# Patient Record
Sex: Female | Born: 1985 | Race: White | Hispanic: No | Marital: Single | Smoking: Never smoker
Health system: Southern US, Community
[De-identification: ages and names within clinical notes are randomized; demographics above are authoritative.]

## PROBLEM LIST (undated history)

## (undated) DIAGNOSIS — N200 Calculus of kidney: Secondary | ICD-10-CM

---

## 2015-08-27 ENCOUNTER — Encounter: Payer: Self-pay | Admitting: Emergency Medicine

## 2015-08-27 ENCOUNTER — Emergency Department
Admission: EM | Admit: 2015-08-27 | Discharge: 2015-08-27 | Disposition: A | Discharge status: Home / Self Care | Payer: Self-pay | Attending: Emergency Medicine | Admitting: Emergency Medicine

## 2015-08-27 ENCOUNTER — Emergency Department: Payer: Self-pay

## 2015-08-27 DIAGNOSIS — Z3202 Encounter for pregnancy test, result negative: Secondary | ICD-10-CM | POA: Insufficient documentation

## 2015-08-27 DIAGNOSIS — N23 Unspecified renal colic: Secondary | ICD-10-CM | POA: Insufficient documentation

## 2015-08-27 HISTORY — DX: Calculus of kidney: N20.0

## 2015-08-27 LAB — COMPREHENSIVE METABOLIC PANEL
ALBUMIN: 3.6 g/dL (ref 3.5–5.0)
ALK PHOS: 96 U/L (ref 38–126)
ALT: 17 U/L (ref 14–54)
ANION GAP: 6 (ref 5–15)
AST: 20 U/L (ref 15–41)
BUN: 14 mg/dL (ref 6–20)
CALCIUM: 8.9 mg/dL (ref 8.9–10.3)
CHLORIDE: 104 mmol/L (ref 101–111)
CO2: 26 mmol/L (ref 22–32)
CREATININE: 0.76 mg/dL (ref 0.44–1.00)
GFR calc Af Amer: >60 mL/min (ref >60)
GFR calc non Af Amer: >60 mL/min (ref >60)
GLUCOSE: 140 mg/dL — AB (ref 65–99)
Potassium: 3.8 mmol/L (ref 3.5–5.1)
SODIUM: 136 mmol/L (ref 135–145)
Total Bilirubin: 0.6 mg/dL (ref 0.3–1.2)
Total Protein: 7.1 g/dL (ref 6.5–8.1)

## 2015-08-27 LAB — URINALYSIS COMPLETE WITH MICROSCOPIC (ARMC ONLY)
Bilirubin Urine: NEGATIVE
Glucose, UA: NEGATIVE mg/dL
KETONES UR: NEGATIVE mg/dL
Leukocytes, UA: NEGATIVE
Nitrite: NEGATIVE
PH: 6 (ref 5.0–8.0)
PROTEIN: NEGATIVE mg/dL
Specific Gravity, Urine: 1.018 (ref 1.005–1.030)

## 2015-08-27 LAB — CBC
HCT: 38.8 % (ref 35.0–47.0)
HEMOGLOBIN: 13.2 g/dL (ref 12.0–16.0)
MCH: 29 pg (ref 26.0–34.0)
MCHC: 34 g/dL (ref 32.0–36.0)
MCV: 85.2 fL (ref 80.0–100.0)
Platelets: 294 10*3/uL (ref 150–440)
RBC: 4.55 MIL/uL (ref 3.80–5.20)
RDW: 13.4 % (ref 11.5–14.5)
WBC: 12.6 10*3/uL — ABNORMAL HIGH (ref 3.6–11.0)

## 2015-08-27 LAB — HCG, QUANTITATIVE, PREGNANCY: HCG, BETA CHAIN, QUANT, S: 1 m[IU]/mL (ref <5)

## 2015-08-27 MED ORDER — TAMSULOSIN HCL 0.4 MG PO CAPS
0.4000 mg | ORAL_CAPSULE | Freq: Once | ORAL | Status: AC
  Administered 2015-08-27: 0.4 mg via ORAL
  Filled 2015-08-27: qty 1

## 2015-08-27 MED ORDER — ONDANSETRON HCL 4 MG PO TABS: 4.0000 mg | ORAL_TABLET | Freq: Three times a day (TID) | ORAL | Status: AC | PRN

## 2015-08-27 MED ORDER — ONDANSETRON HCL 4 MG/2ML IJ SOLN
4.0000 mg | Freq: Once | INTRAMUSCULAR | Status: AC
  Administered 2015-08-27: 4 mg via INTRAVENOUS

## 2015-08-27 MED ORDER — HYDROMORPHONE HCL 1 MG/ML IJ SOLN
1.0000 mg | Freq: Once | INTRAMUSCULAR | Status: AC
  Administered 2015-08-27: 1 mg via INTRAVENOUS
  Filled 2015-08-27: qty 1

## 2015-08-27 MED ORDER — HALOPERIDOL LACTATE 5 MG/ML IJ SOLN
1.0000 mg | Freq: Once | INTRAMUSCULAR | Status: AC
  Administered 2015-08-27: 1 mg via INTRAVENOUS
  Filled 2015-08-27: qty 1

## 2015-08-27 MED ORDER — SODIUM CHLORIDE 0.9 % IV BOLUS (SEPSIS)
1000.0000 mL | Freq: Once | INTRAVENOUS | Status: AC
  Administered 2015-08-27: 1000 mL via INTRAVENOUS

## 2015-08-27 MED ORDER — ONDANSETRON HCL 4 MG/2ML IJ SOLN
4.0000 mg | Freq: Once | INTRAMUSCULAR | Status: AC
  Administered 2015-08-27: 4 mg via INTRAVENOUS
  Filled 2015-08-27: qty 2

## 2015-08-27 MED ORDER — KETOROLAC TROMETHAMINE 30 MG/ML IJ SOLN
30.0000 mg | Freq: Once | INTRAMUSCULAR | Status: AC
  Administered 2015-08-27: 30 mg via INTRAVENOUS
  Filled 2015-08-27: qty 1

## 2015-08-27 MED ORDER — DIPHENHYDRAMINE HCL 50 MG/ML IJ SOLN
25.0000 mg | Freq: Once | INTRAMUSCULAR | Status: AC
  Administered 2015-08-27: 25 mg via INTRAVENOUS
  Filled 2015-08-27: qty 1

## 2015-08-27 MED ORDER — OXYCODONE-ACETAMINOPHEN 5-325 MG PO TABS: 1.0000 | ORAL_TABLET | ORAL | Status: AC | PRN

## 2015-08-27 MED ORDER — OXYCODONE-ACETAMINOPHEN 5-325 MG PO TABS
2.0000 | ORAL_TABLET | Freq: Once | ORAL | Status: AC
  Administered 2015-08-27: 2 via ORAL
  Filled 2015-08-27: qty 2

## 2015-08-27 MED ORDER — TAMSULOSIN HCL 0.4 MG PO CAPS: 0.4000 mg | ORAL_CAPSULE | Freq: Every day | ORAL | Status: AC

## 2015-08-27 MED ORDER — SODIUM CHLORIDE 0.9 % IV SOLN
1000.0000 mL | Freq: Once | INTRAVENOUS | Status: AC
  Administered 2015-08-27: 1000 mL via INTRAVENOUS

## 2015-08-27 NOTE — ED Provider Notes (Signed)
-----------------------------------------   7:47 AM on 08/27/2015 -----------------------------------------   Blood pressure 134/90, pulse 97, temperature 97.7 F (36.5 C), temperature source Oral, resp. rate 16, height 5\' 8"  (1.727 m), weight 365 lb (165.563 kg), last menstrual period 08/09/2015, SpO2 96 %.  Assuming care from Dr. Dolores FrameSung.  In short, Mary Baird is a 29 y.o. female with a chief complaint of Back Pain .  Refer to the original H&P for additional details.  The current plan of care is to follow-up the urinalysis results and reassess for pain control and nausea control.   ----------------------------------------- 8:48 AM on 08/27/2015 -----------------------------------------  The urinalysis is significant only for hematuria.  There is no evidence of infection and the patient does not need antibiotics at this time; to be absolutely certain, a urine culture was sent as well.  The patient complains of persistent moderate pain so she was given 2 Percocet by mouth.  She had another episode of emesis so we are attempting to improve this with Haldol 1 mg IV and Benadryl 25 mg IV, a combination frequently used by anesthesiologist to control nausea and vomiting.  I anticipate she will be appropriate for discharge and outpatient follow-up as recommended by Dr. Dolores FrameSung.  Mary Roseory Anaiz Qazi, MD 08/27/15 [phone]

## 2015-08-27 NOTE — ED Notes (Signed)
Pt says she went to bed feeling fine; woke with right mid back pain; has not voided since; history of kidney stones but it's been about 5 years; thinks she may have one now; nausea with vomiting, last vomited upon arrival to ED parking lot

## 2015-08-27 NOTE — ED Provider Notes (Signed)
[facility] Emergency Department Provider Note  ____________________________________________  Time seen: Approximately 4:31 AM  I have reviewed the triage vital signs and the nursing notes.   HISTORY  Chief Complaint Back Pain    HPI Mary Baird is a 29 y.o. female who presents to the ED from home with a chief complaint of left flank pain. Patient was awake and at approximately 1 AM with sharp, stabbing left flank pain which radiated to her left lower quadrant. Prior history of kidney stones; no prior lithotripsy or stents. Symptoms this morning are associated with nausea and vomiting, and difficulty voiding. Denies fever, chills, chest pain, shortness of breath, dysuria, hematuria, diarrhea. Nothing makes her symptoms better or worse.   Past Medical History  Diagnosis Date  . Kidney stone     There are no active problems to display for this patient.   History reviewed. No pertinent past surgical history.  No current outpatient prescriptions on file.  Allergies Review of patient's allergies indicates no known allergies.  History reviewed. No pertinent family history.  Social History Social History  Substance Use Topics  . Smoking status: Never Smoker   . Smokeless tobacco: Never Used  . Alcohol Use: No    Review of Systems Constitutional: No fever/chills Eyes: No visual changes. ENT: No sore throat. Cardiovascular: Denies chest pain. Respiratory: Denies shortness of breath. Gastrointestinal: Positive for left flank and abdominal pain.  Positive for nausea and vomiting.  No diarrhea.  No constipation. Genitourinary: Negative for dysuria. Musculoskeletal: Negative for back pain. Skin: Negative for rash. Neurological: Negative for headaches, focal weakness or numbness.  10-point ROS otherwise negative.  ____________________________________________   PHYSICAL EXAM:  VITAL SIGNS: ED Triage Vitals  Enc Vitals Group     BP 08/27/15  0318 150/93 mmHg     Pulse Rate 08/27/15 0318 99     Resp 08/27/15 0318 20     Temp 08/27/15 0318 97.7 F (36.5 C)     Temp Source 08/27/15 0318 Oral     SpO2 08/27/15 0318 99 %     Weight 08/27/15 0318 365 lb (165.563 kg)     Height 08/27/15 0318  (1.727 m)     Head Cir --      Peak Flow --      Pain Score 08/27/15 0320 10     Pain Loc --      Pain Edu? --      Excl. in GC? --     Constitutional: Alert and oriented. Well appearing and in mild acute distress. Eyes: Conjunctivae are normal. PERRL. EOMI. Head: Atraumatic. Nose: No congestion/rhinnorhea. Mouth/Throat: Mucous membranes are moist.  Oropharynx non-erythematous. Neck: No stridor.   Cardiovascular: Normal rate, regular rhythm. Grossly normal heart sounds.  Good peripheral circulation. Respiratory: Normal respiratory effort.  No retractions. Lungs CTAB. Gastrointestinal: Soft and nontender. No distention. No abdominal bruits. Mild left CVA tenderness. Musculoskeletal: No lower extremity tenderness nor edema.  No joint effusions. Neurologic:  Normal speech and language. No gross focal neurologic deficits are appreciated.  Skin:  Skin is warm, dry and intact. No rash noted. Psychiatric: Mood and affect are normal. Speech and behavior are normal.  ____________________________________________   LABS (all labs ordered are listed, but only abnormal results are displayed)  Labs Reviewed  COMPREHENSIVE METABOLIC PANEL - Abnormal; Notable for the following:    Glucose, Bld 140 (*)    All other components within normal limits  CBC - Abnormal; Notable for the following:  WBC 12.6 (*)    All other components within normal limits  HCG, QUANTITATIVE, PREGNANCY  URINALYSIS COMPLETEWITH MICROSCOPIC ([facility] ONLY)   ____________________________________________  EKG  None ____________________________________________  RADIOLOGY  CT Renal Stone Study interpreted per Dr. Andria MeuseStevens: Larina BrasStone in the proximal left ureter with  moderate proximal obstruction. Uterine fibroid. ____________________________________________   PROCEDURES  Procedure(s) performed: None  Critical Care performed: No  ____________________________________________   INITIAL IMPRESSION / ASSESSMENT AND PLAN / ED COURSE  Pertinent labs & imaging results that were available during my care of the patient were reviewed by me and considered in my medical decision making (see chart for details).  29 year old female who presents with left flank pain radiating to her left lower quadrant; prior history of stones. Will initiate IV fluid resuscitation, IV analgesia and antiemetic; obtain CT renal study to evaluate for kidney stones.  ----------------------------------------- 6:58 AM on 08/27/2015 -----------------------------------------  Patient improved after Dilaudid. Second liter IV fluids infusing. Updated patient and family of laboratory and imaging results. Anticipate discharge home if pain is controlled with close urology follow-up. Awaiting urinalysis results. Care transferred to Dr. York CeriseForbach pending urinalysis and reassessment for pain control. ____________________________________________   FINAL CLINICAL IMPRESSION(S) / ED DIAGNOSES  Final diagnoses:  Renal colic on left side      Irean HongJade J Ethelwyn Gilbertson, MD 08/27/15 [phone]

## 2015-08-27 NOTE — Discharge Instructions (Signed)
1. Take pain & nausea medicines as needed (Percocet/Zofran #30). Make sure to take a stool softener while taking narcotic pain medicines. °2. Take Flomax 0.4mg daily x 14 days. °3. Drink plenty of bottled or filtered water daily. °4. Return to the ER for worsening symptoms, persistent vomiting, fever, difficulty breathing or other concerns. ° ° °Kidney Stones °Kidney stones (urolithiasis) are deposits that form inside your kidneys. The intense pain is caused by the stone moving through the urinary tract. When the stone moves, the ureter goes into spasm around the stone. The stone is usually passed in the urine.  °CAUSES  °· A disorder that makes certain neck glands produce too much parathyroid hormone (primary hyperparathyroidism). °· A buildup of uric acid crystals, similar to gout in your joints. °· Narrowing (stricture) of the ureter. °· A kidney obstruction present at birth (congenital obstruction). °· Previous surgery on the kidney or ureters. °· Numerous kidney infections. °SYMPTOMS  °· Feeling sick to your stomach (nauseous). °· Throwing up (vomiting). °· Blood in the urine (hematuria). °· Pain that usually spreads (radiates) to the groin. °· Frequency or urgency of urination. °DIAGNOSIS  °· Taking a history and physical exam. °· Blood or urine tests. °· CT scan. °· Occasionally, an examination of the inside of the urinary bladder (cystoscopy) is performed. °TREATMENT  °· Observation. °· Increasing your fluid intake. °· Extracorporeal shock wave lithotripsy--This is a noninvasive procedure that uses shock waves to break up kidney stones. °· Surgery may be needed if you have severe pain or persistent obstruction. There are various surgical procedures. Most of the procedures are performed with the use of small instruments. Only small incisions are needed to accommodate these instruments, so recovery time is minimized. °The size, location, and chemical composition are all important variables that will determine  the proper choice of action for you. Talk to your health care provider to better understand your situation so that you will minimize the risk of injury to yourself and your kidney.  °HOME CARE INSTRUCTIONS  °· Drink enough water and fluids to keep your urine clear or pale yellow. This will help you to pass the stone or stone fragments. °· Strain all urine through the provided strainer. Keep all particulate matter and stones for your health care provider to see. The stone causing the pain may be as small as a grain of salt. It is very important to use the strainer each and every time you pass your urine. The collection of your stone will allow your health care provider to analyze it and verify that a stone has actually passed. The stone analysis will often identify what you can do to reduce the incidence of recurrences. °· Only take over-the-counter or prescription medicines for pain, discomfort, or fever as directed by your health care provider. °· Keep all follow-up visits as told by your health care provider. This is important. °· Get follow-up X-rays if required. The absence of pain does not always mean that the stone has passed. It may have only stopped moving. If the urine remains completely obstructed, it can cause loss of kidney function or even complete destruction of the kidney. It is your responsibility to make sure X-rays and follow-ups are completed. Ultrasounds of the kidney can show blockages and the status of the kidney. Ultrasounds are not associated with any radiation and can be performed easily in a matter of minutes. °· Make changes to your daily diet as told by your health care provider. You may be told   to: °¨ Limit the amount of salt that you eat. °¨ Eat 5 or more servings of fruits and vegetables each day. °¨ Limit the amount of meat, poultry, fish, and eggs that you eat. °· Collect a 24-hour urine sample as told by your health care provider. You may need to collect another urine sample every  6-12 months. °SEEK MEDICAL CARE IF: °· You experience pain that is progressive and unresponsive to any pain medicine you have been prescribed. °SEEK IMMEDIATE MEDICAL CARE IF:  °· Pain cannot be controlled with the prescribed medicine. °· You have a fever or shaking chills. °· The severity or intensity of pain increases over 18 hours and is not relieved by pain medicine. °· You develop a new onset of abdominal pain. °· You feel faint or pass out. °· You are unable to urinate. °  °This information is not intended to replace advice given to you by your health care provider. Make sure you discuss any questions you have with your health care provider. °  °Document Released: 10/28/2005 Document Revised: 07/19/2015 Document Reviewed: 03/31/2013 °Elsevier Interactive Patient Education ©2016 [employer] ° °

## 2015-08-28 LAB — URINE CULTURE: SPECIAL REQUESTS: NORMAL

## 2018-08-29 ENCOUNTER — Emergency Department
Admission: EM | Admit: 2018-08-29 | Discharge: 2018-08-29 | Disposition: A | Discharge status: Home / Self Care | Payer: No Typology Code available for payment source | Attending: Emergency Medicine | Admitting: Emergency Medicine

## 2018-08-29 ENCOUNTER — Encounter: Payer: Self-pay | Admitting: Emergency Medicine

## 2018-08-29 DIAGNOSIS — A4901 Methicillin susceptible Staphylococcus aureus infection, unspecified site: Secondary | ICD-10-CM | POA: Diagnosis not present

## 2018-08-29 DIAGNOSIS — B958 Unspecified staphylococcus as the cause of diseases classified elsewhere: Secondary | ICD-10-CM

## 2018-08-29 DIAGNOSIS — R21 Rash and other nonspecific skin eruption: Secondary | ICD-10-CM | POA: Diagnosis present

## 2018-08-29 MED ORDER — CEPHALEXIN 500 MG PO CAPS
500.0000 mg | ORAL_CAPSULE | Freq: Once | ORAL | Status: AC
  Administered 2018-08-29: 500 mg via ORAL
  Filled 2018-08-29: qty 1

## 2018-08-29 MED ORDER — DIPHENHYDRAMINE HCL 25 MG PO TABS: 25.0000 mg | ORAL_TABLET | Freq: Three times a day (TID) | ORAL | 0 refills | Status: AC | PRN

## 2018-08-29 MED ORDER — CEPHALEXIN 500 MG PO CAPS: 500.0000 mg | ORAL_CAPSULE | Freq: Three times a day (TID) | ORAL | 0 refills | Status: AC

## 2018-08-29 MED ORDER — DIPHENHYDRAMINE HCL 25 MG PO CAPS
25.0000 mg | ORAL_CAPSULE | Freq: Once | ORAL | Status: AC
  Administered 2018-08-29: 25 mg via ORAL
  Filled 2018-08-29: qty 1

## 2018-08-29 NOTE — ED Notes (Signed)
Patient states her mother is waiting for her in the parking lot.

## 2018-08-29 NOTE — ED Provider Notes (Signed)
[facility] Emergency Department Provider Note  ____________________________________________  Time seen: Approximately 10:48 PM  I have reviewed the triage vital signs and the nursing notes.   HISTORY  Chief Complaint Rash    HPI Mary Baird is a 32 y.o. female with a pruritic, maculopapular rash that is diffuse across body for the past month.  Rash has small vesicles and has been present for approximately 1 month.  Patient reports that rash originally started around ankles.  Patient was scratching at area and then noticed that rash migrated around legs, torso, back and arms.  Patient has not tried any alleviating medications at home.  She denies fever or chills. No other alleviating measures have been attempted.    Past Medical History:  Diagnosis Date  . Kidney stone     There are no active problems to display for this patient.   History reviewed. No pertinent surgical history.  Prior to Admission medications   Medication Sig Start Date End Date Taking? Authorizing Provider  cephALEXin (KEFLEX) 500 MG capsule Take 1 capsule (500 mg total) by mouth 3 (three) times daily for 7 days. 08/29/18 09/05/18  Orvil Feil, PA-C  diphenhydrAMINE (BENADRYL ALLERGY) 25 MG tablet Take 1 tablet (25 mg total) by mouth every 8 (eight) hours as needed for up to 5 days. 08/29/18 09/03/18  Orvil Feil, PA-C  ondansetron (ZOFRAN) 4 MG tablet Take 1 tablet (4 mg total) by mouth every 8 (eight) hours as needed for nausea or vomiting. 08/27/15   Irean Hong, MD  oxyCODONE-acetaminophen (ROXICET) 5-325 MG tablet Take 1 tablet by mouth every 4 (four) hours as needed for severe pain. 08/27/15   Irean Hong, MD  tamsulosin (FLOMAX) 0.4 MG CAPS capsule Take 1 capsule (0.4 mg total) by mouth daily. 08/27/15   Irean Hong, MD    Allergies Patient has no known allergies.  No family history on file.  Social History Social History   Tobacco Use  . Smoking status:  Never Smoker  . Smokeless tobacco: Never Used  Substance Use Topics  . Alcohol use: No  . Drug use: No     Review of Systems  Constitutional: No fever/chills Eyes: No visual changes. No discharge ENT: No upper respiratory complaints. Cardiovascular: no chest pain. Respiratory: no cough. No SOB. Gastrointestinal: No abdominal pain.  No nausea, no vomiting.  No diarrhea.  No constipation. Genitourinary: Negative for dysuria. No hematuria Musculoskeletal: Negative for musculoskeletal pain. Skin: Patient has rash.  Neurological: Negative for headaches, focal weakness or numbness.   ____________________________________________   PHYSICAL EXAM:  VITAL SIGNS: ED Triage Vitals [08/29/18 1923]  Enc Vitals Group     BP (!) 149/104     Pulse Rate (!) 101     Resp 20     Temp 97.8 F (36.6 C)     Temp Source Oral     SpO2 100 %     Weight (!) 380 lb (172.4 kg)     Height 5\' 8"  (1.727 m)     Head Circumference      Peak Flow      Pain Score 0     Pain Loc      Pain Edu?      Excl. in GC?      Constitutional: Alert and oriented. Well appearing and in no acute distress. Eyes: Conjunctivae are normal. PERRL. EOMI. Head: Atraumatic. ENT:      Ears: TMs are pearly.  Nose: No congestion/rhinnorhea.      Mouth/Throat: Mucous membranes are moist.  Neck: No stridor. No cervical spine tenderness to palpation. Cardiovascular: Normal rate, regular rhythm. Normal S1 and S2.  Good peripheral circulation. Respiratory: Normal respiratory effort without tachypnea or retractions. Lungs CTAB. Good air entry to the bases with no decreased or absent breath sounds. Gastrointestinal: Bowel sounds 4 quadrants. Soft and nontender to palpation. No guarding or rigidity. No palpable masses. No distention. No CVA tenderness. Musculoskeletal: Full range of motion to all extremities. No gross deformities appreciated. Neurologic:  Normal speech and language. No gross focal neurologic deficits are  appreciated.  Skin: Patient has maculopapular, erythematous rash of the upper and lower extremities, torso and back with overlying vesicle formation and some regions of honey colored crusts. Psychiatric: Mood and affect are normal. Speech and behavior are normal. Patient exhibits appropriate insight and judgement.   ____________________________________________   LABS (all labs ordered are listed, but only abnormal results are displayed)  Labs Reviewed - No data to display ____________________________________________  EKG   ____________________________________________  RADIOLOGY   No results found.  ____________________________________________    PROCEDURES  Procedure(s) performed:    Procedures    Medications  cephALEXin (KEFLEX) capsule 500 mg (500 mg Oral Given 08/29/18 2218)  diphenhydrAMINE (BENADRYL) capsule 25 mg (25 mg Oral Given 08/29/18 2218)     ____________________________________________   INITIAL IMPRESSION / ASSESSMENT AND PLAN / ED COURSE  Pertinent labs & imaging results that were available during my care of the patient were reviewed by me and considered in my medical decision making (see chart for details).  Review of the [place] CSRS was performed in accordance of the [facility] prior to dispensing any controlled drugs.      Assessment and plan Staff infection Patient presents to the emergency department with an erythematous, pruritic, maculopapular rash of the upper extremities, back and torso that is been present for approximately 1 month.  Rashes consistent with staph infection.  Patient was treated empirically with Keflex and advised to follow-up with primary care as needed.  All patient questions were answered.     ____________________________________________  FINAL CLINICAL IMPRESSION(S) / ED DIAGNOSES  Final diagnoses:  Staph infection      NEW MEDICATIONS STARTED DURING THIS VISIT:  ED Discharge Orders         Ordered     cephALEXin (KEFLEX) 500 MG capsule  3 times daily     08/29/18 2205    diphenhydrAMINE (BENADRYL ALLERGY) 25 MG tablet  Every 8 hours PRN     08/29/18 2205              This chart was dictated using voice recognition software/Dragon. Despite best efforts to proofread, errors can occur which can change the meaning. Any change was purely unintentional.    Orvil Feil, PA-C 08/29/18 2348    Emily Filbert, MD 08/30/18 1325

## 2018-08-29 NOTE — ED Triage Notes (Signed)
Patient with rash to bilateral arms and legs that started last week. Patient states that the rash is getting worse.

## 2018-09-03 ENCOUNTER — Other Ambulatory Visit: Payer: Self-pay

## 2018-09-03 ENCOUNTER — Encounter: Payer: Self-pay | Admitting: Emergency Medicine

## 2018-09-03 ENCOUNTER — Emergency Department
Admission: EM | Admit: 2018-09-03 | Discharge: 2018-09-03 | Disposition: A | Discharge status: Home / Self Care | Payer: No Typology Code available for payment source | Attending: Emergency Medicine | Admitting: Emergency Medicine

## 2018-09-03 DIAGNOSIS — L239 Allergic contact dermatitis, unspecified cause: Secondary | ICD-10-CM | POA: Diagnosis not present

## 2018-09-03 DIAGNOSIS — R21 Rash and other nonspecific skin eruption: Secondary | ICD-10-CM | POA: Diagnosis present

## 2018-09-03 MED ORDER — RANITIDINE HCL 150 MG PO TABS: 150.0000 mg | ORAL_TABLET | Freq: Two times a day (BID) | ORAL | 0 refills | Status: AC

## 2018-09-03 MED ORDER — PREDNISONE 10 MG (21) PO TBPK: ORAL_TABLET | ORAL | 0 refills | Status: AC

## 2018-09-03 NOTE — ED Triage Notes (Signed)
Pt reports that she was here a few days for a rash, states they gave her a diagnosis with staph infections she states that the rash is getting worse. She was told that she may need a steroid. She is on antibiotics, and benadryl that are not helping.

## 2018-09-03 NOTE — ED Provider Notes (Signed)
[facility] Emergency Department Provider Note  ____________________________________________  Time seen: Approximately 3:03 PM  I have reviewed the triage vital signs and the nursing notes.   HISTORY  Chief Complaint Rash   HPI Mary Baird is a 32 y.o. female who presents to the emergency department for treatment and evaluation of rash. She is unsure of what has caused her to break out. 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 She was diagnosed with staph infection last week and has been taking Keflex without much improvement. Rash is on extremities and now spreading onto her stomach. She is taking benadryl for the itching and using calamine lotion. She denies fever, shortness of breath, or abdominal pain.  Past Medical History:  Diagnosis Date  . Kidney stone     There are no active problems to display for this patient.   History reviewed. No pertinent surgical history.  Prior to Admission medications   Medication Sig Start Date End Date Taking? Authorizing Provider  cephALEXin (KEFLEX) 500 MG capsule Take 1 capsule (500 mg total) by mouth 3 (three) times daily for 7 days. 08/29/18 09/05/18  Orvil Feil, PA-C  diphenhydrAMINE (BENADRYL ALLERGY) 25 MG tablet Take 1 tablet (25 mg total) by mouth every 8 (eight) hours as needed for up to 5 days. 08/29/18 09/03/18  Orvil Feil, PA-C  ondansetron (ZOFRAN) 4 MG tablet Take 1 tablet (4 mg total) by mouth every 8 (eight) hours as needed for nausea or vomiting. 08/27/15   Irean Hong, MD  oxyCODONE-acetaminophen (ROXICET) 5-325 MG tablet Take 1 tablet by mouth every 4 (four) hours as needed for severe pain. 08/27/15   Irean Hong, MD  predniSONE (STERAPRED UNI-PAK 21 TAB) 10 MG (21) TBPK tablet Take 6 tablets on the first day and decrease by 1 tablet each day until finished. 09/03/18   Tirrell Buchberger, Rulon Eisenmenger B, FNP  ranitidine (ZANTAC) 150 MG  tablet Take 1 tablet (150 mg total) by mouth 2 (two) times daily. 09/03/18 09/03/19  Virdia Ziesmer, Rulon Eisenmenger B, FNP  tamsulosin (FLOMAX) 0.4 MG CAPS capsule Take 1 capsule (0.4 mg total) by mouth daily. 08/27/15   Irean Hong, MD    Allergies Patient has no known allergies.  History reviewed. No pertinent family history.  Social History Social History   Tobacco Use  . Smoking status: Never Smoker  . Smokeless tobacco: Never Used  Substance Use Topics  . Alcohol use: No  . Drug use: No    Review of Systems  Constitutional: Negative for fever. Respiratory: Negative for cough or shortness of breath.  Musculoskeletal: Negative for myalgias Skin: Positive for pruritic rash. Neurological: Negative for numbness or paresthesias. ____________________________________________   PHYSICAL EXAM:  VITAL SIGNS: ED Triage Vitals  Enc Vitals Group     BP 09/03/18 1346 (!) 176/103     Pulse Rate 09/03/18 1346 (!) 104     Resp --      Temp 09/03/18 1346 98.2 F (36.8 C)     Temp Source 09/03/18 1346 Oral     SpO2 09/03/18 1346 100 %     Weight 09/03/18 1347 (!) 380 lb (172.4 kg)     Height 09/03/18 1347 5\' 8"  (1.727 m)     Head Circumference --      Peak Flow --      Pain Score 09/03/18 1347 0     Pain Loc --  Pain Edu? --      Excl. in GC? --      Constitutional: Well appearing. Eyes: Conjunctivae are clear without discharge or drainage. Nose: No rhinorrhea noted. Mouth/Throat: Airway is patent.  Neck: No stridor. Unrestricted range of motion observed. Cardiovascular: Capillary refill is <3 seconds.  Respiratory: Respirations are even and unlabored.. Musculoskeletal: Unrestricted range of motion observed. Neurologic: Awake, alert, and oriented x 4.  Skin:  Urticarial rash over arms and legs bilaterally.  ____________________________________________   LABS (all labs ordered are listed, but only abnormal results are displayed)  Labs Reviewed - No data to  display ____________________________________________  EKG  Not indicated. ____________________________________________  RADIOLOGY  Not indicated. ____________________________________________   PROCEDURES  Procedures ____________________________________________   INITIAL IMPRESSION / ASSESSMENT AND PLAN / ED COURSE  Mary Baird is a 32 y.o. female who presents to the ER for second evaluation of a rash. Today, the rash appears as hives. There is no drainage or crusting that may indicate staph, however she was advised to finish out the Keflex. She will be treated with prednisone, zantac, and benadryl. She is to schedule an appointment with dermatology if not improving over the week. She is to return to the ER for symptoms that change or worsen or for new concerns if unable to see PCP or dermatology.   Medications - No data to display   Pertinent labs & imaging results that were available during my care of the patient were reviewed by me and considered in my medical decision making (see chart for details).  ____________________________________________   FINAL CLINICAL IMPRESSION(S) / ED DIAGNOSES  Final diagnoses:  Allergic contact dermatitis, unspecified trigger    ED Discharge Orders         Ordered    predniSONE (STERAPRED UNI-PAK 21 TAB) 10 MG (21) TBPK tablet     09/03/18 1505    ranitidine (ZANTAC) 150 MG tablet  2 times daily     09/03/18 1505           Note:  This document was prepared using Dragon voice recognition software and may include unintentional dictation errors.    Chinita Pester, FNP 09/04/18 1113    Governor Rooks, MD 09/05/18 [phone]

## 2018-09-03 NOTE — Discharge Instructions (Signed)
Please follow up with dermatology if not improving with medications.  Return to the ER for symptoms that change or worsen if unable to see primary care or dermatology.

## 2019-01-11 ENCOUNTER — Other Ambulatory Visit: Payer: Self-pay

## 2019-01-11 ENCOUNTER — Emergency Department
Admission: EM | Admit: 2019-01-11 | Discharge: 2019-01-11 | Disposition: A | Discharge status: Home / Self Care | Payer: No Typology Code available for payment source | Attending: Emergency Medicine | Admitting: Emergency Medicine

## 2019-01-11 ENCOUNTER — Emergency Department: Payer: No Typology Code available for payment source

## 2019-01-11 DIAGNOSIS — R1030 Lower abdominal pain, unspecified: Secondary | ICD-10-CM | POA: Diagnosis not present

## 2019-01-11 DIAGNOSIS — M545 Low back pain, unspecified: Secondary | ICD-10-CM

## 2019-01-11 LAB — CBC
HCT: 42 % (ref 36.0–46.0)
HEMOGLOBIN: 13.6 g/dL (ref 12.0–15.0)
MCH: 28.3 pg (ref 26.0–34.0)
MCHC: 32.4 g/dL (ref 30.0–36.0)
MCV: 87.3 fL (ref 80.0–100.0)
PLATELETS: 303 10*3/uL (ref 150–400)
RBC: 4.81 MIL/uL (ref 3.87–5.11)
RDW: 12.6 % (ref 11.5–15.5)
WBC: 11.1 10*3/uL — AB (ref 4.0–10.5)
nRBC: 0 % (ref 0.0–0.2)

## 2019-01-11 LAB — POCT PREGNANCY, URINE: PREG TEST UR: NEGATIVE

## 2019-01-11 LAB — URINALYSIS, COMPLETE (UACMP) WITH MICROSCOPIC
BILIRUBIN URINE: NEGATIVE
Bacteria, UA: NONE SEEN
GLUCOSE, UA: NEGATIVE mg/dL
HGB URINE DIPSTICK: NEGATIVE
Ketones, ur: NEGATIVE mg/dL
Leukocytes,Ua: NEGATIVE
Nitrite: NEGATIVE
PH: 5 (ref 5.0–8.0)
Protein, ur: NEGATIVE mg/dL
Specific Gravity, Urine: 1.014 (ref 1.005–1.030)

## 2019-01-11 LAB — COMPREHENSIVE METABOLIC PANEL
ALK PHOS: 81 U/L (ref 38–126)
ALT: 17 U/L (ref 0–44)
ANION GAP: 8 (ref 5–15)
AST: 19 U/L (ref 15–41)
Albumin: 3.8 g/dL (ref 3.5–5.0)
BILIRUBIN TOTAL: 0.5 mg/dL (ref 0.3–1.2)
BUN: 14 mg/dL (ref 6–20)
CALCIUM: 8.5 mg/dL — AB (ref 8.9–10.3)
CO2: 25 mmol/L (ref 22–32)
Chloride: 105 mmol/L (ref 98–111)
Creatinine, Ser: 0.57 mg/dL (ref 0.44–1.00)
GFR calc Af Amer: >60 mL/min (ref >60)
Glucose, Bld: 115 mg/dL — ABNORMAL HIGH (ref 70–99)
POTASSIUM: 3.8 mmol/L (ref 3.5–5.1)
Sodium: 138 mmol/L (ref 135–145)
TOTAL PROTEIN: 7.3 g/dL (ref 6.5–8.1)

## 2019-01-11 LAB — PREGNANCY, URINE: PREG TEST UR: NEGATIVE

## 2019-01-11 MED ORDER — CYCLOBENZAPRINE HCL 10 MG PO TABS: 10.0000 mg | ORAL_TABLET | Freq: Three times a day (TID) | ORAL | 0 refills | Status: AC | PRN

## 2019-01-11 MED ORDER — LIDOCAINE 5 % EX PTCH
1.0000 | MEDICATED_PATCH | CUTANEOUS | Status: DC
  Administered 2019-01-11 (×2): 1 via TRANSDERMAL
  Filled 2019-01-11: qty 1

## 2019-01-11 MED ORDER — LIDOCAINE 5 % EX PTCH
1.0000 | MEDICATED_PATCH | CUTANEOUS | Status: DC
  Filled 2019-01-11: qty 1

## 2019-01-11 MED ORDER — OXYCODONE-ACETAMINOPHEN 5-325 MG PO TABS
1.0000 | ORAL_TABLET | Freq: Once | ORAL | Status: AC
  Administered 2019-01-11: 1 via ORAL
  Filled 2019-01-11: qty 1

## 2019-01-11 MED ORDER — CYCLOBENZAPRINE HCL 10 MG PO TABS
10.0000 mg | ORAL_TABLET | Freq: Once | ORAL | Status: AC
  Administered 2019-01-11: 10 mg via ORAL
  Filled 2019-01-11: qty 1

## 2019-01-11 NOTE — ED Notes (Signed)
Pt wheeled to mother's car

## 2019-01-11 NOTE — ED Notes (Signed)
Patient to CT via wheelchair.

## 2019-01-11 NOTE — ED Notes (Addendum)
ED Provider at bedside.  Pt with mother c/o lower back pain at top of glteal clef, reports last week very stressful with death in boyfriend's family and two days ago lifted large bag of dog food  Denies hx of dysuria  Pt reports unable to sit upright, or walk

## 2019-01-11 NOTE — ED Provider Notes (Signed)
[facility] Emergency Department Provider Note  ____________________________________________   First MD Initiated Contact with Patient 01/11/19 [phone]     (approximate)  I have reviewed the triage vital signs and the nursing notes.   HISTORY  Chief Complaint Back Pain (Possible kidney stone)    HPI Mary Baird is a 33 y.o. female with history of kidney stones in the past presents to the emergency department with acute onset of low back pain that began tonight with current pain score of 10 out of 10 worse with movement.  Patient denies any trauma.  Patient states that she was seated at the time of onset of back pain.  Patient denies any dysuria no hematuria.  Patient denies any fever.  She denies any abdominal pain.        Past Medical History:  Diagnosis Date  . Kidney stone     There are no active problems to display for this patient.   History reviewed. No pertinent surgical history.  Prior to Admission medications   Medication Sig Start Date End Date Taking? Authorizing Provider  cyclobenzaprine (FLEXERIL) 10 MG tablet Take 1 tablet (10 mg total) by mouth 3 (three) times daily as needed. 01/11/19   Darci Current, MD  diphenhydrAMINE (BENADRYL ALLERGY) 25 MG tablet Take 1 tablet (25 mg total) by mouth every 8 (eight) hours as needed for up to 5 days. 08/29/18 09/03/18  Orvil Feil, PA-C  ondansetron (ZOFRAN) 4 MG tablet Take 1 tablet (4 mg total) by mouth every 8 (eight) hours as needed for nausea or vomiting. 08/27/15   Irean Hong, MD  oxyCODONE-acetaminophen (ROXICET) 5-325 MG tablet Take 1 tablet by mouth every 4 (four) hours as needed for severe pain. 08/27/15   Irean Hong, MD  predniSONE (STERAPRED UNI-PAK 21 TAB) 10 MG (21) TBPK tablet Take 6 tablets on the first day and decrease by 1 tablet each day until finished. 09/03/18   Triplett, Rulon Eisenmenger B, FNP  ranitidine (ZANTAC) 150 MG tablet Take 1 tablet (150 mg total) by mouth 2 (two) times  daily. 09/03/18 09/03/19  Triplett, Rulon Eisenmenger B, FNP  tamsulosin (FLOMAX) 0.4 MG CAPS capsule Take 1 capsule (0.4 mg total) by mouth daily. 08/27/15   Irean Hong, MD    Allergies Patient has no known allergies.  No family history on file.  Social History Social History   Tobacco Use  . Smoking status: Never Smoker  . Smokeless tobacco: Never Used  Substance Use Topics  . Alcohol use: No  . Drug use: No    Review of Systems Constitutional: No fever/chills Eyes: No visual changes. ENT: No sore throat. Cardiovascular: Denies chest pain. Respiratory: Denies shortness of breath. Gastrointestinal: No abdominal pain.  No nausea, no vomiting.  No diarrhea.  No constipation. Genitourinary: Negative for dysuria. Musculoskeletal: Negative for neck pain.  Positive for back pain. Integumentary: Negative for rash. Neurological: Negative for headaches, focal weakness or numbness.   ____________________________________________   PHYSICAL EXAM:  VITAL SIGNS: ED Triage Vitals  Enc Vitals Group     BP 01/11/19 0038 (!) 157/104     Pulse Rate 01/11/19 0038 98     Resp 01/11/19 0038 16     Temp 01/11/19 0038 98 F (36.7 C)     Temp Source 01/11/19 0038 Oral     SpO2 01/11/19 0038 100 %     Weight 01/11/19 0037 (!) 176.9 kg (390 lb)     Height 01/11/19 0037 1.727 m (5'  8")     Head Circumference --      Peak Flow --      Pain Score 01/11/19 0043 9     Pain Loc --      Pain Edu? --      Excl. in GC? --     Constitutional: Alert and oriented.  Apparent discomfort  eyes: Conjunctivae are normal. Mouth/Throat: Mucous membranes are moist.  Oropharynx non-erythematous. Neck: No stridor.   Cardiovascular: Normal rate, regular rhythm. Good peripheral circulation. Grossly normal heart sounds. Respiratory: Normal respiratory effort.  No retractions. Lungs CTAB. Gastrointestinal: Soft and nontender. No distention.  Musculoskeletal: Pain with lumbar paraspinal muscle  palpation. Neurologic:  Normal speech and language. No gross focal neurologic deficits are appreciated.  Skin:  Skin is warm, dry and intact. No rash noted. Psychiatric: Mood and affect are normal. Speech and behavior are normal.  ____________________________________________   LABS (all labs ordered are listed, but only abnormal results are displayed)  Labs Reviewed  COMPREHENSIVE METABOLIC PANEL - Abnormal; Notable for the following components:      Result Value   Glucose, Bld 115 (*)    Calcium 8.5 (*)    All other components within normal limits  CBC - Abnormal; Notable for the following components:   WBC 11.1 (*)    All other components within normal limits  URINALYSIS, COMPLETE (UACMP) WITH MICROSCOPIC - Abnormal; Notable for the following components:   Color, Urine YELLOW (*)    APPearance CLEAR (*)    All other components within normal limits  PREGNANCY, URINE  POCT PREGNANCY, URINE  POC URINE PREG, ED   ________________  RADIOLOGY I, [place] N BROWN, personally viewed and evaluated these images (plain radiographs) as part of my medical decision making, as well as reviewing the written report by the radiologist.  ED MD interpretation: CT renal revealed no evidence of stones or obstructive uropathy no acute findings or explanation of flank pain per radiologist.  Official radiology report(s): Ct Renal Stone Study  Result Date: 01/11/2019 CLINICAL DATA:  Flank pain. History of kidney stone. EXAM: CT ABDOMEN AND PELVIS WITHOUT CONTRAST TECHNIQUE: Multidetector CT imaging of the abdomen and pelvis was performed following the standard protocol without IV contrast. COMPARISON:  CT 08/27/2015 FINDINGS: Lower chest: The lung bases are clear. Hepatobiliary: Mild hepatic steatosis without focal abnormality. Gallbladder physiologically distended, no calcified stone. No biliary dilatation. Pancreas: No ductal dilatation or inflammation. Spleen: Normal in size without focal abnormality.  Adrenals/Urinary Tract: Normal adrenal glands. No hydronephrosis, perinephric edema, or urolithiasis. Both ureters are decompressed. Urinary bladder is partially distended, no bladder stone or wall thickening. Stomach/Bowel: Stomach is within normal limits. Appendix is tentatively identified and appears normal. No evidence of bowel wall thickening, distention, or inflammatory changes. Vascular/Lymphatic: Normal caliber abdominal aorta. Small central mesenteric nodes in the small bowel mesentery, not enlarged by size criteria. Reproductive: Fundal fibroid in the uterus. No adnexal mass. Other: No ascites or free air. Small fat containing umbilical hernia. Musculoskeletal: There are no acute or suspicious osseous abnormalities. IMPRESSION: 1. No renal stones or obstructive uropathy. No acute findings or explanation for flank pain. 2. Mild hepatic steatosis. Electronically Signed   By: Narda Rutherford M.D.   On: 01/11/2019 03:40    Procedures   ____________________________________________   INITIAL IMPRESSION / MDM / ASSESSMENT AND PLAN / ED COURSE  As part of my medical decision making, I reviewed the following data within the electronic MEDICAL RECORD NUMBER  33 year old female presenting with above-stated  history and physical exam concern for possible ureterolithiasis, muscle spasm/strain versus radiculopathy.  CT scan revealed no evidence of ureterolithiasis.  Lidoderm patch applied to the patient's back Flexeril given with improvement of pain current pain score 6.  Patient will be prescribed Flexeril for home.  Patient advised to follow-up with Dr. Reginia Naas orthopedic surgeon the pain were to recur or worsen.    ____________________________________________  FINAL CLINICAL IMPRESSION(S) / ED DIAGNOSES  Final diagnoses:  Acute bilateral low back pain without sciatica     MEDICATIONS GIVEN DURING THIS VISIT:  Medications  lidocaine (LIDODERM) 5 % 1 patch (has no administration in time range)    lidocaine (LIDODERM) 5 % 1 patch (has no administration in time range)  cyclobenzaprine (FLEXERIL) tablet 10 mg (has no administration in time range)  oxyCODONE-acetaminophen (PERCOCET/ROXICET) 5-325 MG per tablet 1 tablet (1 tablet Oral Given 01/11/19 0113)     ED Discharge Orders         Ordered    cyclobenzaprine (FLEXERIL) 10 MG tablet  3 times daily PRN     01/11/19 0522           Note:  This document was prepared using Dragon voice recognition software and may include unintentional dictation errors.   Darci Current, MD 01/11/19 [id number]

## 2019-01-11 NOTE — ED Notes (Signed)
Patient assisted to the bathroom. Patient has more mobility than when she was originally triaged. Is asking for a muscle relaxer.

## 2019-01-11 NOTE — ED Notes (Signed)
Pt reports "I don't feel any better", pt oriented as to expectations to pharmacological treatments, pt still sitting hunched over, pt encouraged to walk around  Pt able to ambulate slowly around the room without assistance, pt wants this RN to push wheelchair so pt's mother doesn't have to, mother shown to exit to bring car forward

## 2019-01-11 NOTE — ED Triage Notes (Signed)
Patient to ED for lower back pain. Feels like kidney stone pain. Last kidney stone was 08/2015 and this feels similar. Denies dysuria, urgency or frequency. Denies gross hematuria. States nausea without vomiting.

## 2019-01-11 NOTE — ED Notes (Signed)
No peripheral IV placed this visit.   Discharge instructions reviewed with patient. Questions fielded by this RN. Patient verbalizes understanding of instructions. Patient discharged home in stable condition per brown. No acute distress noted at time of discharge.   

## 2019-09-24 IMAGING — CT CT RENAL STONE PROTOCOL
2 of 4 series · 16 of 46 positions shown, 18 images · non-contrast
Comparison: CT 08/27/2015

CLINICAL DATA: Flank pain. History of kidney stone.

EXAM:
CT ABDOMEN AND PELVIS WITHOUT CONTRAST
TECHNIQUE: Multidetector CT imaging of the abdomen and pelvis was performed
following the standard protocol without IV contrast.

[Series 2: stone full standard · axial · 0.98mm/px · z∈[-1008,-538]mm · 13 of 104 slices shown, 15 images]
[im 5/104  soft-tissue]
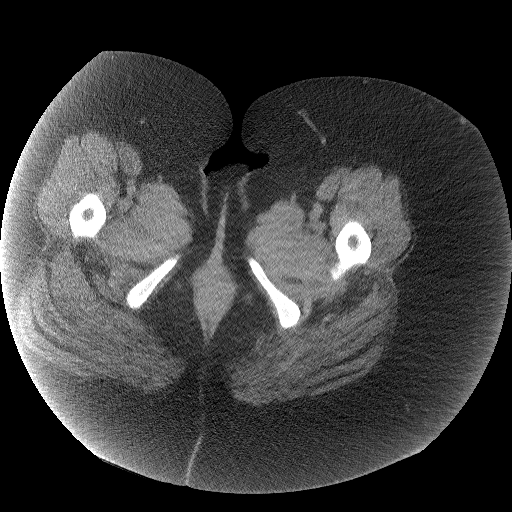
[im 5/104  bone]
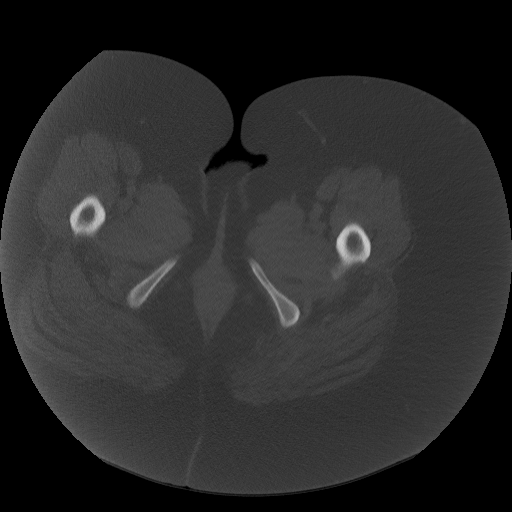
[im 13/104  soft-tissue]
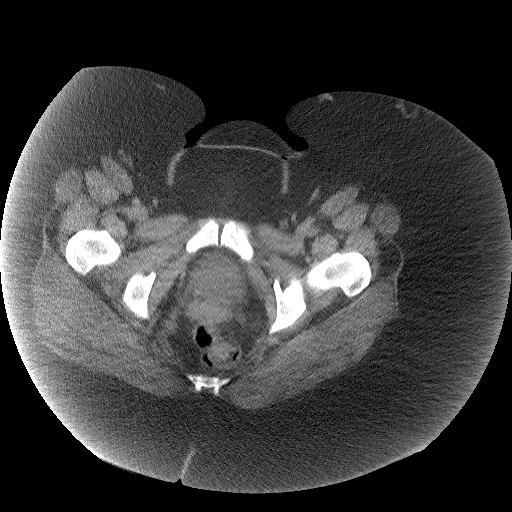
[im 22/104  soft-tissue]
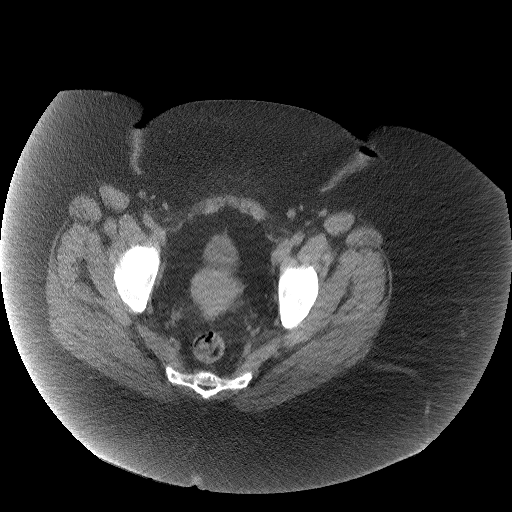
[im 31/104  soft-tissue]
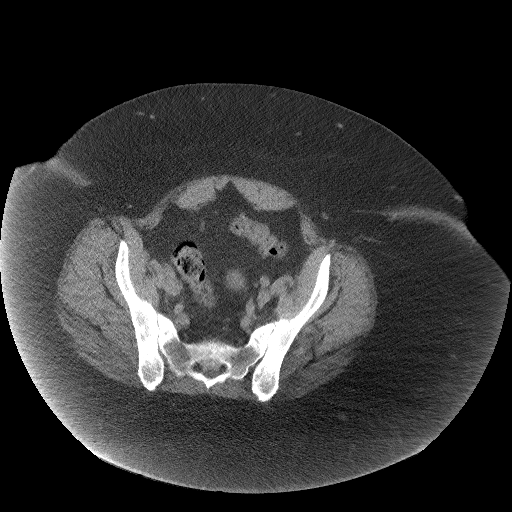
[im 35/104  soft-tissue]
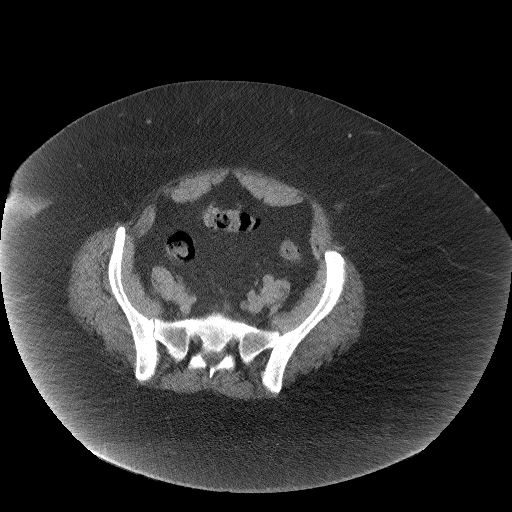
[im 43/104  soft-tissue]
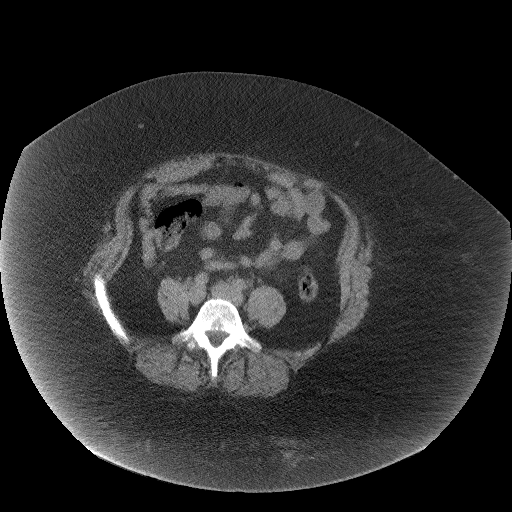
[im 52/104  soft-tissue]
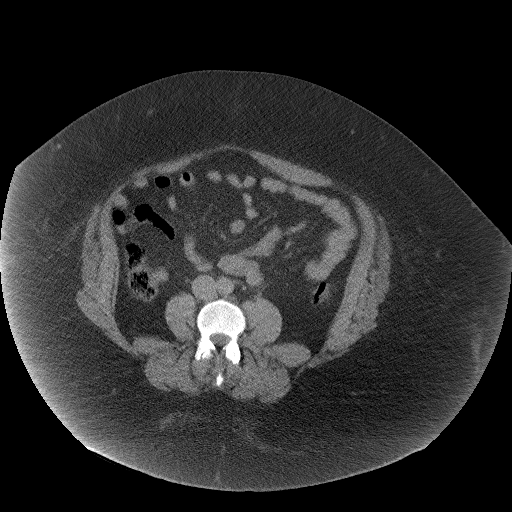
[im 61/104  soft-tissue]
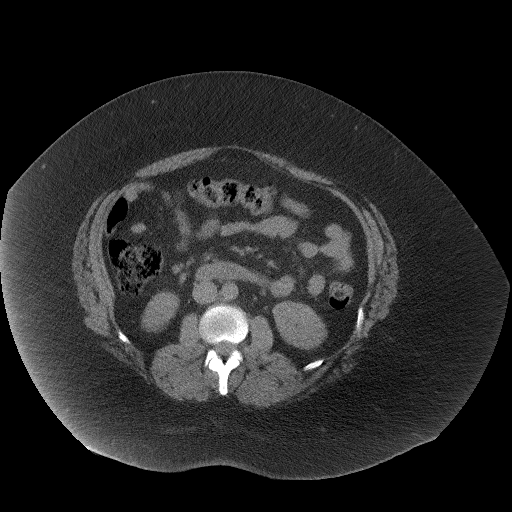
[im 69/104  soft-tissue]
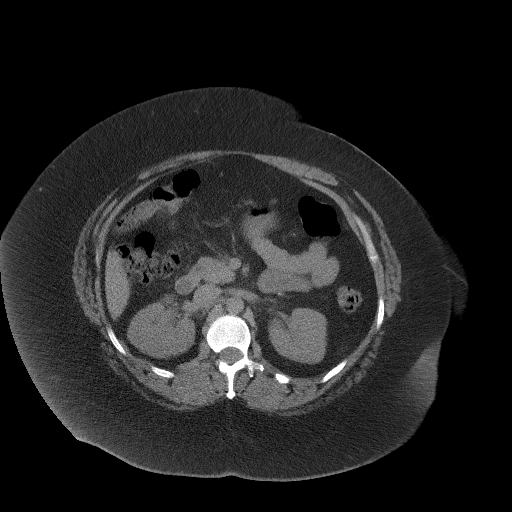
[im 69/104  bone]
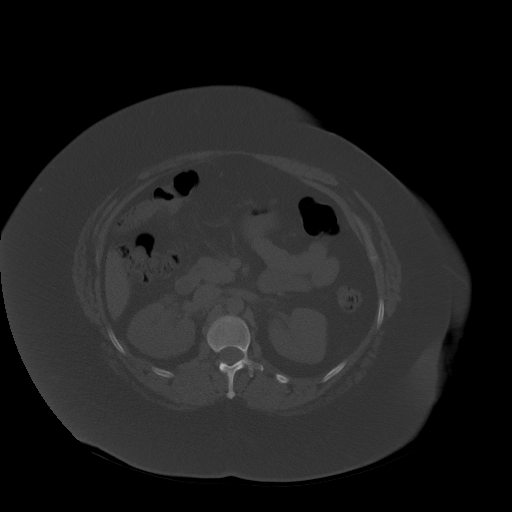
[im 73/104  soft-tissue]
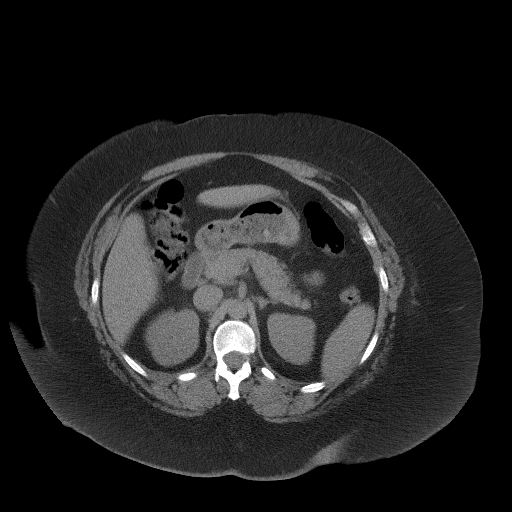
[im 82/104  soft-tissue]
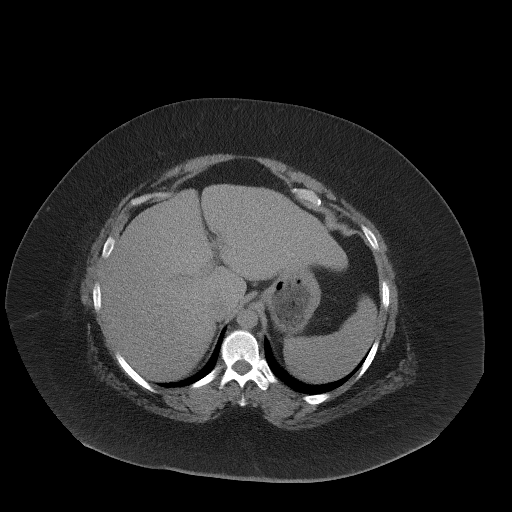
[im 91/104  soft-tissue]
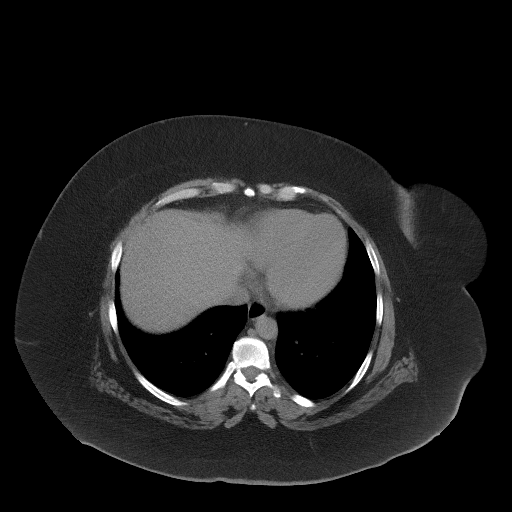
[im 99/104  soft-tissue]
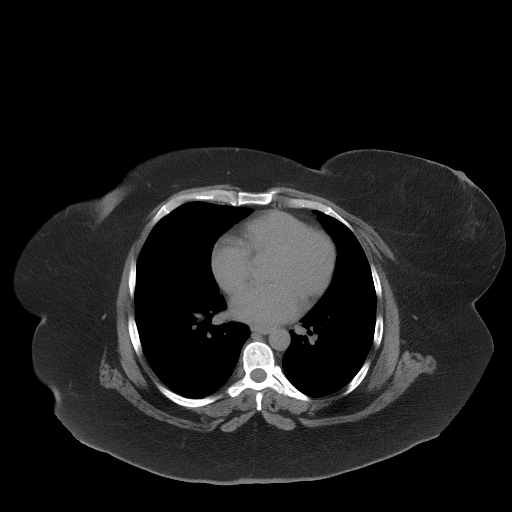

[Series 5: coronal · coronal · 0.82mm/px · 3 of 189 slices shown]
[im 63/189  soft-tissue]
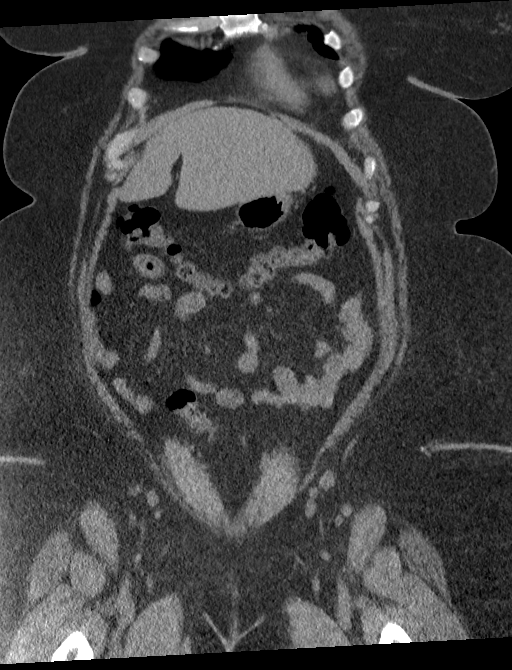
[im 84/189  soft-tissue]
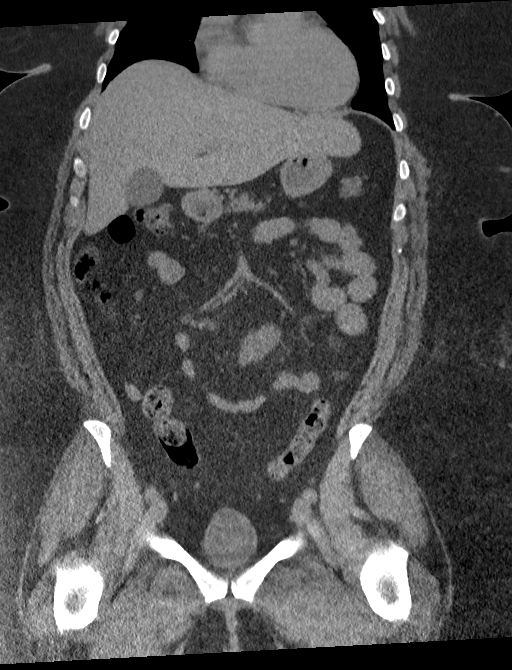
[im 105/189  soft-tissue]
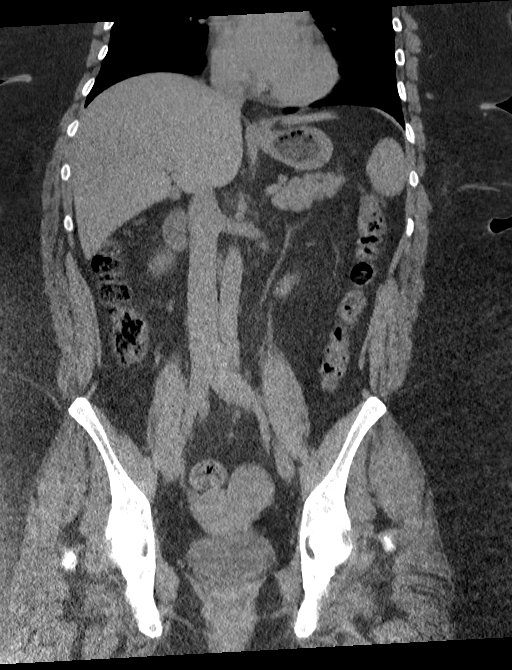

[16 of 46 positions shown; findings below may reference images not displayed]

FINDINGS: Lower chest: The lung bases are clear.

Hepatobiliary: Mild hepatic steatosis without focal abnormality.
Gallbladder physiologically distended, no calcified stone. No
biliary dilatation.

Pancreas: No ductal dilatation or inflammation.

Spleen: Normal in size without focal abnormality.

Adrenals/Urinary Tract: Normal adrenal glands. No hydronephrosis,
perinephric edema, or urolithiasis. Both ureters are decompressed.
Urinary bladder is partially distended, no bladder stone or wall
thickening.

Stomach/Bowel: Stomach is within normal limits. Appendix is
tentatively identified and appears normal. No evidence of bowel wall
thickening, distention, or inflammatory changes.

Vascular/Lymphatic: Normal caliber abdominal aorta. Small central
mesenteric nodes in the small bowel mesentery, not enlarged by size
criteria.

Reproductive: Fundal fibroid in the uterus. No adnexal mass.

Other: No ascites or free air. Small fat containing umbilical
hernia.

Musculoskeletal: There are no acute or suspicious osseous
abnormalities.
IMPRESSION: 1. No renal stones or obstructive uropathy. No acute findings or
explanation for flank pain.
2. Mild hepatic steatosis.
